# Patient Record
Sex: Male | Born: 1965
Health system: Southern US, Community
[De-identification: ages and names within clinical notes are randomized; demographics above are authoritative.]

---

## 2009-09-05 ENCOUNTER — Emergency Department (HOSPITAL_COMMUNITY): Admission: EM | Admit: 2009-09-05 | Discharge: 2009-09-05 | Payer: Self-pay | Admitting: Emergency Medicine

## 2009-10-06 ENCOUNTER — Encounter: Admission: RE | Admit: 2009-10-06 | Discharge: 2009-10-06 | Payer: Self-pay | Admitting: Occupational Medicine

## 2010-03-17 LAB — CBC
MCV: 91 fL (ref 78.0–100.0)
Platelets: 165 10*3/uL (ref 150–400)
RBC: 4.91 MIL/uL (ref 4.22–5.81)
RDW: 13 % (ref 11.5–15.5)
WBC: 8.7 10*3/uL (ref 4.0–10.5)

## 2010-03-17 LAB — DIFFERENTIAL
Basophils Absolute: 0.1 10*3/uL (ref 0.0–0.1)
Eosinophils Relative: 3 % (ref 0–5)
Lymphocytes Relative: 22 % (ref 12–46)
Lymphs Abs: 1.9 10*3/uL (ref 0.7–4.0)
Neutro Abs: 5.8 10*3/uL (ref 1.7–7.7)

## 2010-03-17 LAB — URINALYSIS, ROUTINE W REFLEX MICROSCOPIC
Glucose, UA: NEGATIVE mg/dL
Nitrite: NEGATIVE
Protein, ur: NEGATIVE mg/dL
pH: 5.5 (ref 5.0–8.0)

## 2010-03-17 LAB — BASIC METABOLIC PANEL
Chloride: 113 mEq/L — ABNORMAL HIGH (ref 96–112)
Creatinine, Ser: 1.18 mg/dL (ref 0.4–1.5)
GFR calc Af Amer: >60 mL/min (ref >60)
GFR calc non Af Amer: >60 mL/min (ref >60)
Potassium: 4.1 mEq/L (ref 3.5–5.1)

## 2013-04-08 ENCOUNTER — Other Ambulatory Visit: Payer: Self-pay | Admitting: Nurse Practitioner

## 2013-04-08 ENCOUNTER — Ambulatory Visit
Admission: RE | Admit: 2013-04-08 | Discharge: 2013-04-08 | Disposition: A | Discharge status: Home / Self Care | Payer: Self-pay | Source: Ambulatory Visit | Attending: Nurse Practitioner | Admitting: Nurse Practitioner

## 2013-04-08 DIAGNOSIS — M25561 Pain in right knee: Secondary | ICD-10-CM

## 2014-04-16 IMAGING — CR DG KNEE COMPLETE 4+V*R*
4 series · 4 of 4 positions shown · non-contrast
Comparison: None.

CLINICAL DATA: Medial right knee pain.  Pain with locking.

EXAM:
RIGHT KNEE - COMPLETE 4+ VIEW

[view not recorded (1 of 4)]
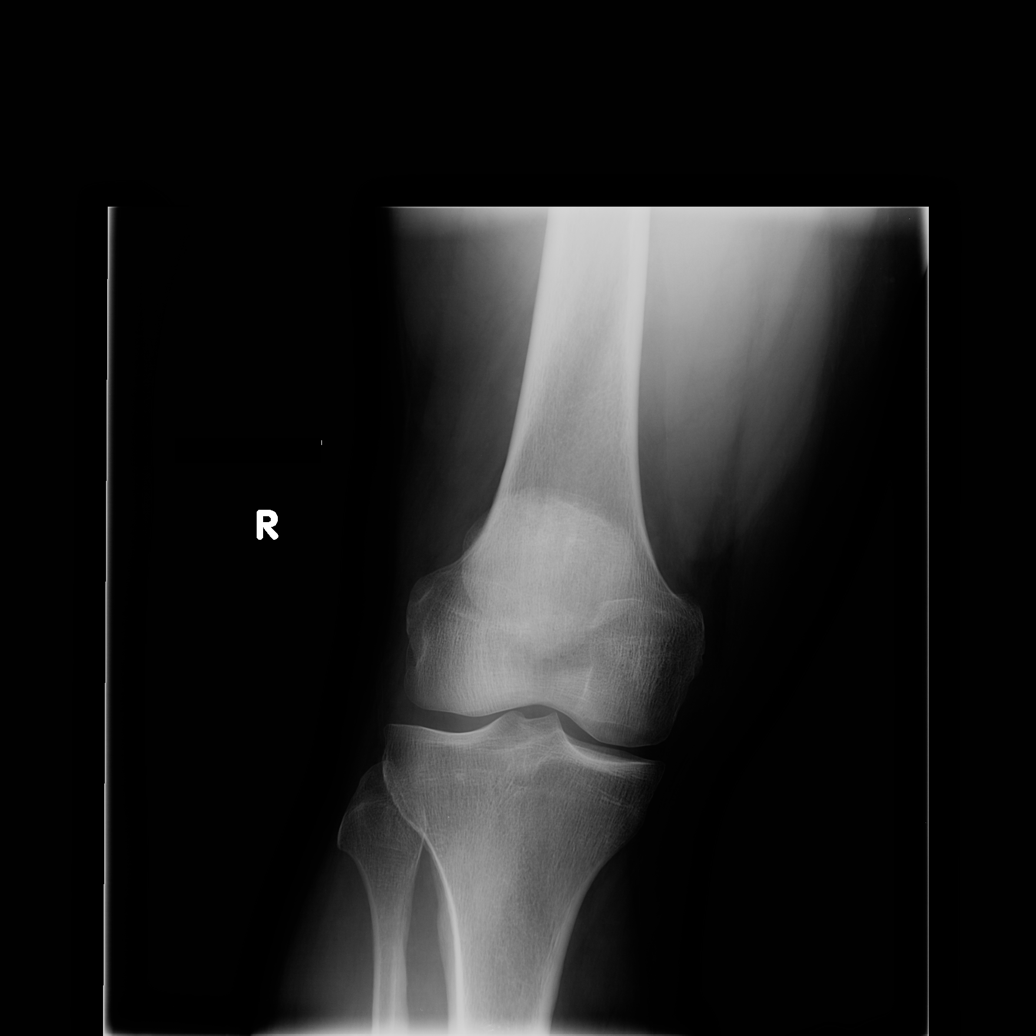

[view not recorded (2 of 4)]
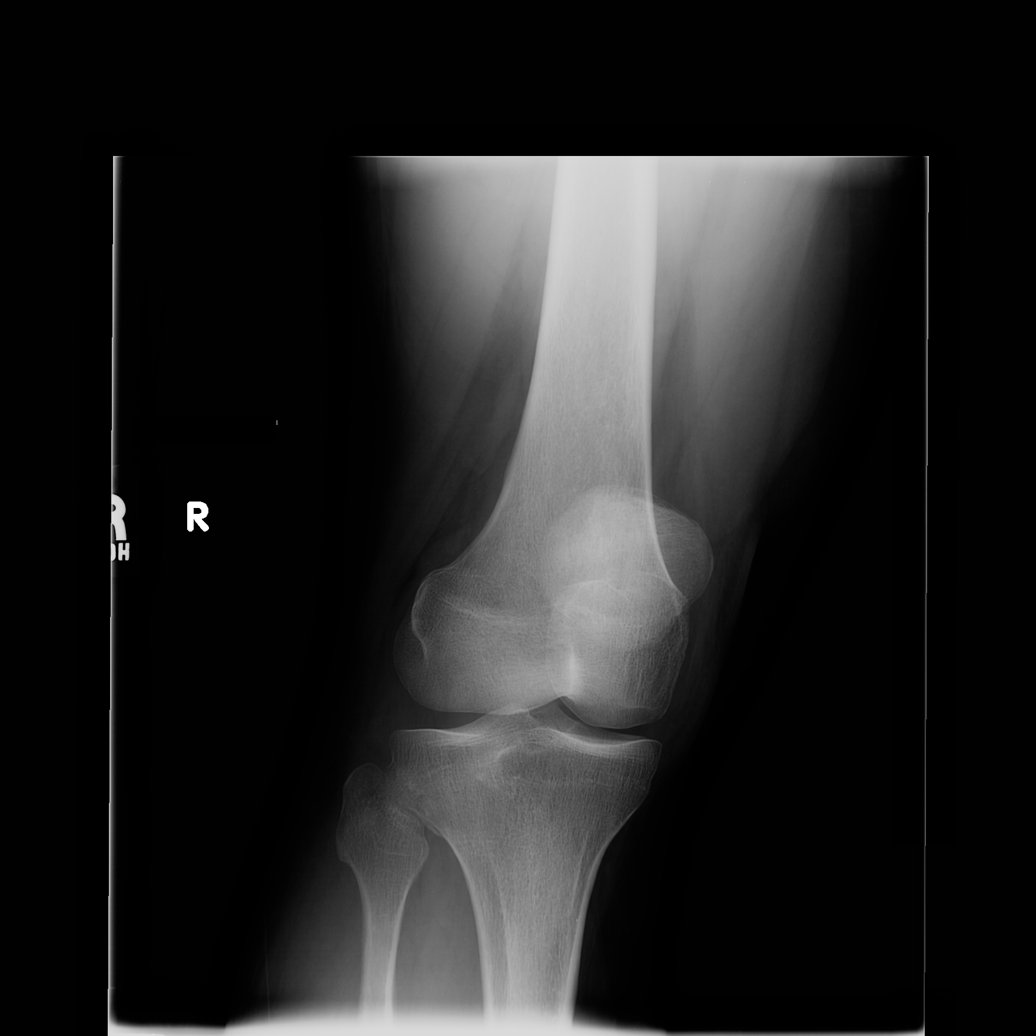

[view not recorded (3 of 4)]
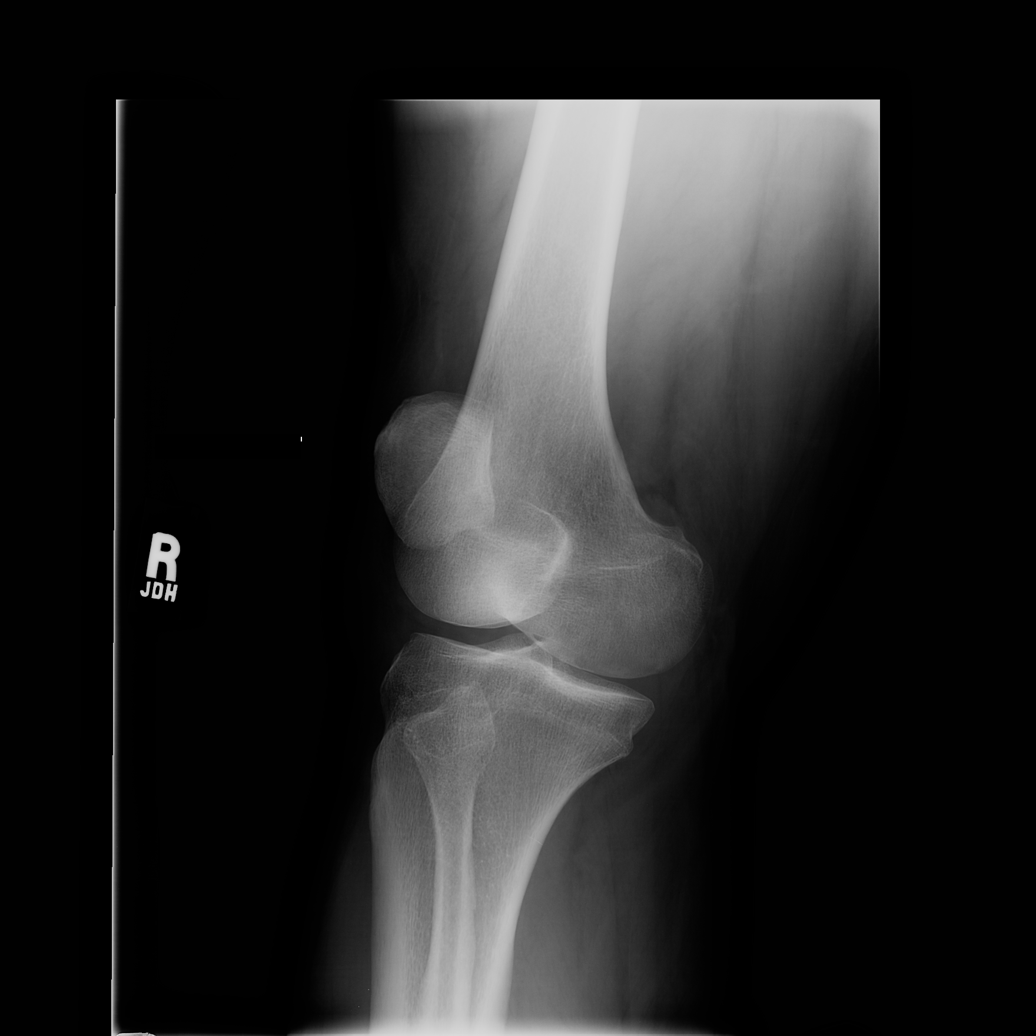

[view not recorded (4 of 4)]
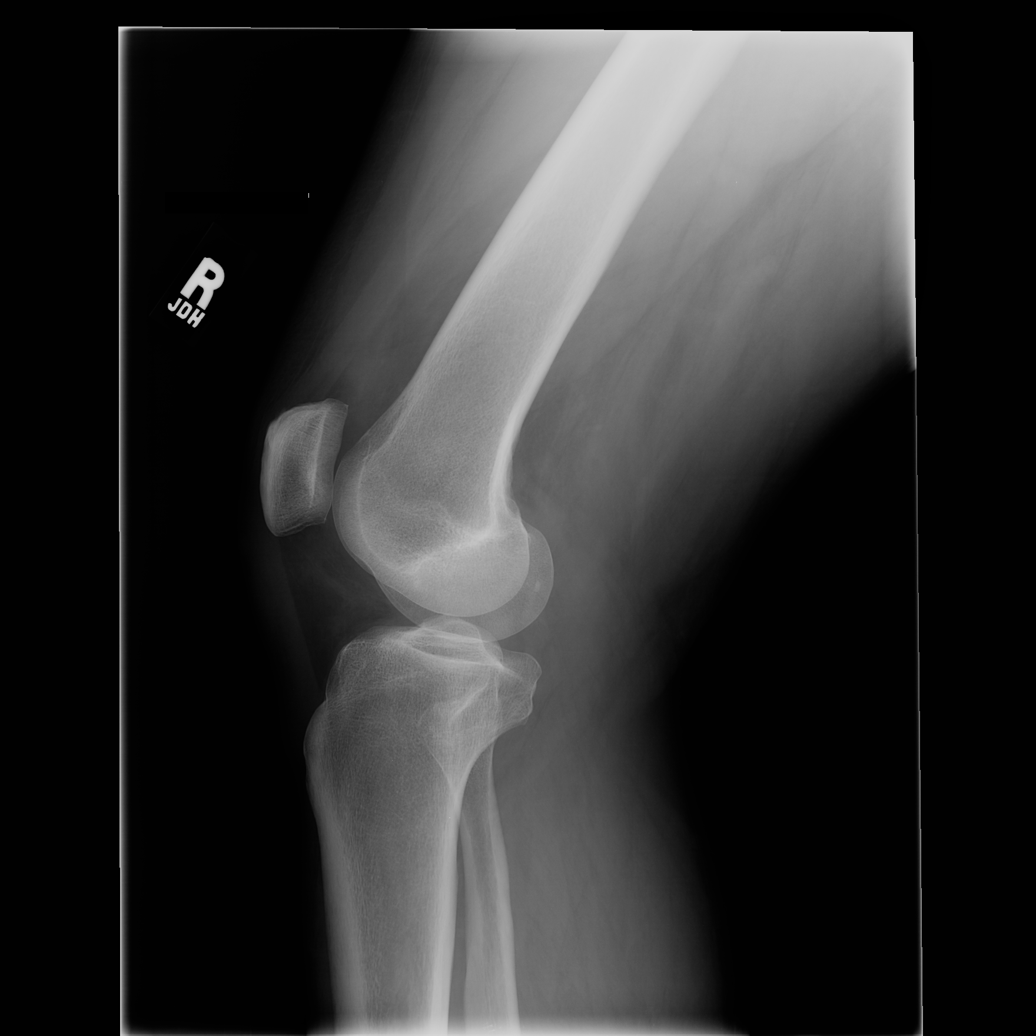

[4 of 4 positions shown; findings below may reference images not displayed]

FINDINGS: Anatomic alignment. Joint spaces are preserved. Tiny marginal
osteophytes are present in the patellofemoral joint. Small
suprapatellar effusion. No destructive osseous lesion or fracture.
IMPRESSION: Mild patellofemoral osteoarthritis and small effusion.

## 2018-09-20 DIAGNOSIS — Z13220 Encounter for screening for lipoid disorders: Secondary | ICD-10-CM | POA: Diagnosis not present

## 2018-09-20 DIAGNOSIS — Z0000 Encounter for general adult medical examination without abnormal findings: Secondary | ICD-10-CM | POA: Diagnosis not present

## 2018-09-20 DIAGNOSIS — Z23 Encounter for immunization: Secondary | ICD-10-CM | POA: Diagnosis not present

## 2018-09-20 DIAGNOSIS — Z125 Encounter for screening for malignant neoplasm of prostate: Secondary | ICD-10-CM | POA: Diagnosis not present

## 2018-10-04 DIAGNOSIS — Z79899 Other long term (current) drug therapy: Secondary | ICD-10-CM | POA: Diagnosis not present

## 2018-10-25 DIAGNOSIS — R7989 Other specified abnormal findings of blood chemistry: Secondary | ICD-10-CM | POA: Diagnosis not present

## 2018-12-23 DIAGNOSIS — Z20828 Contact with and (suspected) exposure to other viral communicable diseases: Secondary | ICD-10-CM | POA: Diagnosis not present

## 2018-12-30 DIAGNOSIS — S90852A Superficial foreign body, left foot, initial encounter: Secondary | ICD-10-CM | POA: Diagnosis not present

## 2018-12-30 DIAGNOSIS — M79672 Pain in left foot: Secondary | ICD-10-CM | POA: Diagnosis not present

## 2019-07-08 ENCOUNTER — Other Ambulatory Visit (HOSPITAL_BASED_OUTPATIENT_CLINIC_OR_DEPARTMENT_OTHER): Payer: Self-pay | Admitting: Internal Medicine

## 2019-09-25 DIAGNOSIS — Z0000 Encounter for general adult medical examination without abnormal findings: Secondary | ICD-10-CM | POA: Diagnosis not present

## 2019-09-25 DIAGNOSIS — E7800 Pure hypercholesterolemia, unspecified: Secondary | ICD-10-CM | POA: Diagnosis not present

## 2019-09-25 DIAGNOSIS — Z125 Encounter for screening for malignant neoplasm of prostate: Secondary | ICD-10-CM | POA: Diagnosis not present

## 2019-09-25 DIAGNOSIS — Z23 Encounter for immunization: Secondary | ICD-10-CM | POA: Diagnosis not present

## 2019-09-25 DIAGNOSIS — I10 Essential (primary) hypertension: Secondary | ICD-10-CM | POA: Diagnosis not present

## 2019-10-17 ENCOUNTER — Other Ambulatory Visit (HOSPITAL_BASED_OUTPATIENT_CLINIC_OR_DEPARTMENT_OTHER): Payer: Self-pay | Admitting: Internal Medicine

## 2019-12-16 ENCOUNTER — Other Ambulatory Visit (HOSPITAL_BASED_OUTPATIENT_CLINIC_OR_DEPARTMENT_OTHER): Payer: Self-pay | Admitting: Family Medicine

## 2019-12-18 ENCOUNTER — Other Ambulatory Visit (HOSPITAL_BASED_OUTPATIENT_CLINIC_OR_DEPARTMENT_OTHER): Payer: Self-pay | Admitting: Family Medicine

## 2020-03-02 ENCOUNTER — Other Ambulatory Visit (HOSPITAL_BASED_OUTPATIENT_CLINIC_OR_DEPARTMENT_OTHER): Payer: Self-pay | Admitting: Internal Medicine

## 2020-04-30 ENCOUNTER — Other Ambulatory Visit (HOSPITAL_BASED_OUTPATIENT_CLINIC_OR_DEPARTMENT_OTHER): Payer: Self-pay

## 2020-04-30 MED FILL — Rosuvastatin Calcium Tab 5 MG: ORAL | 30 days supply | Qty: 30 | Fill #0 | Status: AC

## 2020-04-30 MED FILL — Losartan Potassium Tab 50 MG: ORAL | 30 days supply | Qty: 30 | Fill #0 | Status: AC

## 2020-05-03 ENCOUNTER — Other Ambulatory Visit (HOSPITAL_BASED_OUTPATIENT_CLINIC_OR_DEPARTMENT_OTHER): Payer: Self-pay

## 2020-05-04 ENCOUNTER — Other Ambulatory Visit (HOSPITAL_BASED_OUTPATIENT_CLINIC_OR_DEPARTMENT_OTHER): Payer: Self-pay

## 2020-05-05 ENCOUNTER — Other Ambulatory Visit (HOSPITAL_BASED_OUTPATIENT_CLINIC_OR_DEPARTMENT_OTHER): Payer: Self-pay

## 2020-05-05 MED ORDER — HYDROCHLOROTHIAZIDE 25 MG PO TABS
ORAL_TABLET | ORAL | 3 refills | Status: AC
  Filled 2020-05-05: qty 30, 30d supply, fill #0
  Filled 2020-06-09: qty 30, 30d supply, fill #1
  Filled 2020-07-09: qty 30, 30d supply, fill #2
  Filled 2020-08-04: qty 30, 30d supply, fill #3
  Filled 2020-09-08: qty 30, 30d supply, fill #4
  Filled 2020-10-06: qty 30, 30d supply, fill #5
  Filled 2020-11-08: qty 30, 30d supply, fill #6
  Filled 2021-01-10: qty 30, 30d supply, fill #7

## 2020-05-31 MED FILL — Losartan Potassium Tab 50 MG: ORAL | 30 days supply | Qty: 30 | Fill #1 | Status: CN

## 2020-05-31 MED FILL — Rosuvastatin Calcium Tab 5 MG: ORAL | 30 days supply | Qty: 30 | Fill #1 | Status: CN

## 2020-06-01 ENCOUNTER — Other Ambulatory Visit (HOSPITAL_BASED_OUTPATIENT_CLINIC_OR_DEPARTMENT_OTHER): Payer: Self-pay

## 2020-06-01 MED FILL — Rosuvastatin Calcium Tab 5 MG: ORAL | 30 days supply | Qty: 30 | Fill #1 | Status: AC

## 2020-06-01 MED FILL — Losartan Potassium Tab 50 MG: ORAL | 30 days supply | Qty: 30 | Fill #1 | Status: AC

## 2020-06-09 ENCOUNTER — Other Ambulatory Visit (HOSPITAL_BASED_OUTPATIENT_CLINIC_OR_DEPARTMENT_OTHER): Payer: Self-pay

## 2020-06-29 MED FILL — Losartan Potassium Tab 50 MG: ORAL | 30 days supply | Qty: 30 | Fill #2 | Status: AC

## 2020-06-29 MED FILL — Rosuvastatin Calcium Tab 5 MG: ORAL | 30 days supply | Qty: 30 | Fill #2 | Status: AC

## 2020-06-30 ENCOUNTER — Other Ambulatory Visit (HOSPITAL_BASED_OUTPATIENT_CLINIC_OR_DEPARTMENT_OTHER): Payer: Self-pay

## 2020-07-09 ENCOUNTER — Other Ambulatory Visit (HOSPITAL_BASED_OUTPATIENT_CLINIC_OR_DEPARTMENT_OTHER): Payer: Self-pay

## 2020-08-04 ENCOUNTER — Other Ambulatory Visit (HOSPITAL_BASED_OUTPATIENT_CLINIC_OR_DEPARTMENT_OTHER): Payer: Self-pay

## 2020-08-04 MED FILL — Losartan Potassium Tab 50 MG: ORAL | 30 days supply | Qty: 30 | Fill #3 | Status: AC

## 2020-08-04 MED FILL — Rosuvastatin Calcium Tab 5 MG: ORAL | 30 days supply | Qty: 30 | Fill #3 | Status: AC

## 2020-09-06 MED FILL — Rosuvastatin Calcium Tab 5 MG: ORAL | 30 days supply | Qty: 30 | Fill #4 | Status: AC

## 2020-09-07 ENCOUNTER — Other Ambulatory Visit (HOSPITAL_BASED_OUTPATIENT_CLINIC_OR_DEPARTMENT_OTHER): Payer: Self-pay

## 2020-09-08 ENCOUNTER — Other Ambulatory Visit (HOSPITAL_BASED_OUTPATIENT_CLINIC_OR_DEPARTMENT_OTHER): Payer: Self-pay

## 2020-09-08 MED FILL — Losartan Potassium Tab 50 MG: ORAL | 30 days supply | Qty: 30 | Fill #4 | Status: AC

## 2020-10-06 ENCOUNTER — Other Ambulatory Visit (HOSPITAL_BASED_OUTPATIENT_CLINIC_OR_DEPARTMENT_OTHER): Payer: Self-pay

## 2020-10-06 MED FILL — Rosuvastatin Calcium Tab 5 MG: ORAL | 30 days supply | Qty: 30 | Fill #5 | Status: AC

## 2020-10-06 MED FILL — Losartan Potassium Tab 50 MG: ORAL | 30 days supply | Qty: 30 | Fill #5 | Status: AC

## 2020-11-08 ENCOUNTER — Other Ambulatory Visit (HOSPITAL_BASED_OUTPATIENT_CLINIC_OR_DEPARTMENT_OTHER): Payer: Self-pay

## 2020-11-08 MED ORDER — ROSUVASTATIN CALCIUM 5 MG PO TABS
5.0000 mg | ORAL_TABLET | Freq: Every day | ORAL | 0 refills | Status: DC
  Filled 2020-11-08: qty 30, 30d supply, fill #0
  Filled 2021-08-18: qty 30, 30d supply, fill #1
  Filled 2021-09-19: qty 30, 30d supply, fill #2

## 2020-11-08 MED FILL — Losartan Potassium Tab 50 MG: ORAL | 30 days supply | Qty: 30 | Fill #6 | Status: AC

## 2020-12-07 ENCOUNTER — Other Ambulatory Visit (HOSPITAL_COMMUNITY): Payer: Self-pay

## 2020-12-07 MED ORDER — HYDROCHLOROTHIAZIDE 25 MG PO TABS
25.0000 mg | ORAL_TABLET | Freq: Every day | ORAL | 3 refills | Status: DC
  Filled 2020-12-07 – 2020-12-08 (×2): qty 30, 30d supply, fill #0
  Filled 2021-02-07: qty 30, 30d supply, fill #1
  Filled 2021-03-14 (×2): qty 30, 30d supply, fill #2
  Filled 2021-04-12: qty 30, 30d supply, fill #3
  Filled 2021-04-13: qty 30, 30d supply, fill #0
  Filled 2021-05-13: qty 30, 30d supply, fill #1
  Filled 2021-06-08: qty 30, 30d supply, fill #2
  Filled 2021-07-12: qty 30, 30d supply, fill #3
  Filled 2021-08-18: qty 30, 30d supply, fill #4
  Filled 2021-09-19: qty 30, 30d supply, fill #5
  Filled 2021-10-16: qty 30, 30d supply, fill #6
  Filled 2021-11-16: qty 30, 30d supply, fill #7

## 2020-12-07 MED ORDER — LOSARTAN POTASSIUM 50 MG PO TABS
50.0000 mg | ORAL_TABLET | Freq: Every day | ORAL | 3 refills | Status: DC
  Filled 2020-12-07 – 2020-12-08 (×2): qty 30, 30d supply, fill #0
  Filled 2021-01-10: qty 30, 30d supply, fill #1
  Filled 2021-02-07: qty 30, 30d supply, fill #2
  Filled 2021-03-14 (×2): qty 30, 30d supply, fill #3
  Filled 2021-04-12: qty 30, 30d supply, fill #4
  Filled 2021-04-13: qty 30, 30d supply, fill #0
  Filled 2021-05-13: qty 30, 30d supply, fill #1
  Filled 2021-06-08: qty 30, 30d supply, fill #2
  Filled 2021-07-12: qty 30, 30d supply, fill #3
  Filled 2021-08-18: qty 30, 30d supply, fill #4
  Filled 2021-09-19: qty 30, 30d supply, fill #5
  Filled 2021-10-16: qty 30, 30d supply, fill #6
  Filled 2021-11-15: qty 30, 30d supply, fill #7

## 2020-12-07 MED ORDER — ROSUVASTATIN CALCIUM 5 MG PO TABS
5.0000 mg | ORAL_TABLET | Freq: Every day | ORAL | 3 refills | Status: DC
  Filled 2020-12-07 – 2020-12-08 (×2): qty 30, 30d supply, fill #0
  Filled 2021-01-10: qty 30, 30d supply, fill #1
  Filled 2021-02-07: qty 30, 30d supply, fill #2
  Filled 2021-03-14: qty 8, 8d supply, fill #3
  Filled 2021-03-14: qty 30, 30d supply, fill #3
  Filled 2021-03-14: qty 22, 22d supply, fill #3
  Filled 2021-04-12: qty 30, 30d supply, fill #4
  Filled 2021-04-13: qty 30, 30d supply, fill #0
  Filled 2021-05-13: qty 30, 30d supply, fill #1
  Filled 2021-06-08: qty 30, 30d supply, fill #2
  Filled 2021-07-12: qty 30, 30d supply, fill #3

## 2020-12-08 ENCOUNTER — Other Ambulatory Visit (HOSPITAL_COMMUNITY): Payer: Self-pay

## 2020-12-08 ENCOUNTER — Other Ambulatory Visit (HOSPITAL_BASED_OUTPATIENT_CLINIC_OR_DEPARTMENT_OTHER): Payer: Self-pay

## 2020-12-15 ENCOUNTER — Other Ambulatory Visit (HOSPITAL_BASED_OUTPATIENT_CLINIC_OR_DEPARTMENT_OTHER): Payer: Self-pay

## 2020-12-15 MED ORDER — GAVILYTE-C 240 G PO SOLR
ORAL | 0 refills | Status: AC
  Filled 2020-12-15: qty 4000, 2d supply, fill #0

## 2020-12-16 ENCOUNTER — Other Ambulatory Visit (HOSPITAL_BASED_OUTPATIENT_CLINIC_OR_DEPARTMENT_OTHER): Payer: Self-pay

## 2020-12-17 ENCOUNTER — Other Ambulatory Visit (HOSPITAL_BASED_OUTPATIENT_CLINIC_OR_DEPARTMENT_OTHER): Payer: Self-pay

## 2020-12-24 ENCOUNTER — Other Ambulatory Visit (HOSPITAL_BASED_OUTPATIENT_CLINIC_OR_DEPARTMENT_OTHER): Payer: Self-pay

## 2021-01-10 ENCOUNTER — Other Ambulatory Visit (HOSPITAL_BASED_OUTPATIENT_CLINIC_OR_DEPARTMENT_OTHER): Payer: Self-pay

## 2021-02-07 ENCOUNTER — Other Ambulatory Visit (HOSPITAL_BASED_OUTPATIENT_CLINIC_OR_DEPARTMENT_OTHER): Payer: Self-pay

## 2021-03-14 ENCOUNTER — Other Ambulatory Visit (HOSPITAL_BASED_OUTPATIENT_CLINIC_OR_DEPARTMENT_OTHER): Payer: Self-pay

## 2021-03-14 ENCOUNTER — Other Ambulatory Visit (HOSPITAL_COMMUNITY): Payer: Self-pay

## 2021-04-13 ENCOUNTER — Other Ambulatory Visit (HOSPITAL_COMMUNITY): Payer: Self-pay

## 2021-04-13 ENCOUNTER — Other Ambulatory Visit (HOSPITAL_BASED_OUTPATIENT_CLINIC_OR_DEPARTMENT_OTHER): Payer: Self-pay

## 2021-04-14 ENCOUNTER — Other Ambulatory Visit (HOSPITAL_COMMUNITY): Payer: Self-pay

## 2021-04-14 ENCOUNTER — Other Ambulatory Visit (HOSPITAL_BASED_OUTPATIENT_CLINIC_OR_DEPARTMENT_OTHER): Payer: Self-pay

## 2021-04-15 ENCOUNTER — Other Ambulatory Visit (HOSPITAL_BASED_OUTPATIENT_CLINIC_OR_DEPARTMENT_OTHER): Payer: Self-pay

## 2021-05-13 ENCOUNTER — Other Ambulatory Visit (HOSPITAL_BASED_OUTPATIENT_CLINIC_OR_DEPARTMENT_OTHER): Payer: Self-pay

## 2021-05-16 ENCOUNTER — Other Ambulatory Visit (HOSPITAL_BASED_OUTPATIENT_CLINIC_OR_DEPARTMENT_OTHER): Payer: Self-pay

## 2021-06-08 ENCOUNTER — Other Ambulatory Visit (HOSPITAL_BASED_OUTPATIENT_CLINIC_OR_DEPARTMENT_OTHER): Payer: Self-pay

## 2021-07-12 ENCOUNTER — Other Ambulatory Visit (HOSPITAL_BASED_OUTPATIENT_CLINIC_OR_DEPARTMENT_OTHER): Payer: Self-pay

## 2021-08-18 ENCOUNTER — Other Ambulatory Visit (HOSPITAL_BASED_OUTPATIENT_CLINIC_OR_DEPARTMENT_OTHER): Payer: Self-pay

## 2021-09-19 ENCOUNTER — Other Ambulatory Visit (HOSPITAL_BASED_OUTPATIENT_CLINIC_OR_DEPARTMENT_OTHER): Payer: Self-pay

## 2021-10-16 ENCOUNTER — Other Ambulatory Visit (HOSPITAL_BASED_OUTPATIENT_CLINIC_OR_DEPARTMENT_OTHER): Payer: Self-pay

## 2021-10-17 ENCOUNTER — Other Ambulatory Visit (HOSPITAL_BASED_OUTPATIENT_CLINIC_OR_DEPARTMENT_OTHER): Payer: Self-pay

## 2021-10-19 ENCOUNTER — Other Ambulatory Visit (HOSPITAL_BASED_OUTPATIENT_CLINIC_OR_DEPARTMENT_OTHER): Payer: Self-pay

## 2021-10-19 MED ORDER — ROSUVASTATIN CALCIUM 5 MG PO TABS
ORAL_TABLET | ORAL | 3 refills | Status: DC
  Filled 2021-10-19: qty 22, 22d supply, fill #0
  Filled 2021-10-19: qty 23, 23d supply, fill #0
  Filled 2021-10-19: qty 30, 30d supply, fill #0
  Filled 2021-11-15: qty 30, 30d supply, fill #1
  Filled 2021-12-14: qty 30, 30d supply, fill #2
  Filled 2022-01-17: qty 30, 30d supply, fill #3
  Filled 2022-02-15: qty 30, 30d supply, fill #4
  Filled 2022-03-13: qty 30, 30d supply, fill #5
  Filled 2022-04-12: qty 30, 30d supply, fill #6
  Filled 2022-05-09: qty 30, 30d supply, fill #7
  Filled 2022-06-08: qty 30, 30d supply, fill #8
  Filled 2022-07-04: qty 30, 30d supply, fill #9
  Filled 2022-07-31: qty 30, 30d supply, fill #10
  Filled 2022-08-30: qty 30, 30d supply, fill #11

## 2021-11-16 ENCOUNTER — Other Ambulatory Visit (HOSPITAL_BASED_OUTPATIENT_CLINIC_OR_DEPARTMENT_OTHER): Payer: Self-pay

## 2021-12-14 ENCOUNTER — Other Ambulatory Visit (HOSPITAL_BASED_OUTPATIENT_CLINIC_OR_DEPARTMENT_OTHER): Payer: Self-pay

## 2021-12-15 ENCOUNTER — Other Ambulatory Visit (HOSPITAL_BASED_OUTPATIENT_CLINIC_OR_DEPARTMENT_OTHER): Payer: Self-pay

## 2021-12-15 MED ORDER — LOSARTAN POTASSIUM 50 MG PO TABS
50.0000 mg | ORAL_TABLET | Freq: Every day | ORAL | 3 refills | Status: DC
  Filled 2021-12-15: qty 30, 30d supply, fill #0
  Filled 2022-01-17: qty 30, 30d supply, fill #1
  Filled 2022-02-15: qty 30, 30d supply, fill #2
  Filled 2022-03-13: qty 30, 30d supply, fill #3
  Filled 2022-04-12: qty 30, 30d supply, fill #4
  Filled 2022-05-09: qty 30, 30d supply, fill #5
  Filled 2022-06-08: qty 30, 30d supply, fill #6
  Filled 2022-07-04: qty 30, 30d supply, fill #7
  Filled 2022-07-31: qty 30, 30d supply, fill #8
  Filled 2022-08-30: qty 30, 30d supply, fill #9
  Filled 2022-09-26: qty 30, 30d supply, fill #10
  Filled 2022-10-23: qty 30, 30d supply, fill #11

## 2021-12-15 MED ORDER — HYDROCHLOROTHIAZIDE 25 MG PO TABS
25.0000 mg | ORAL_TABLET | Freq: Every day | ORAL | 3 refills | Status: DC
  Filled 2021-12-15: qty 30, 30d supply, fill #0
  Filled 2022-01-17: qty 30, 30d supply, fill #1
  Filled 2022-02-15: qty 30, 30d supply, fill #2
  Filled 2022-03-13: qty 30, 30d supply, fill #3
  Filled 2022-04-12: qty 30, 30d supply, fill #4
  Filled 2022-05-09: qty 30, 30d supply, fill #5
  Filled 2022-06-08: qty 30, 30d supply, fill #6
  Filled 2022-07-04: qty 30, 30d supply, fill #7
  Filled 2022-07-31: qty 30, 30d supply, fill #8
  Filled 2022-08-30: qty 30, 30d supply, fill #9
  Filled 2022-09-26: qty 30, 30d supply, fill #10
  Filled 2022-10-23: qty 30, 30d supply, fill #11

## 2022-01-17 ENCOUNTER — Other Ambulatory Visit (HOSPITAL_BASED_OUTPATIENT_CLINIC_OR_DEPARTMENT_OTHER): Payer: Self-pay

## 2022-01-19 ENCOUNTER — Other Ambulatory Visit (HOSPITAL_BASED_OUTPATIENT_CLINIC_OR_DEPARTMENT_OTHER): Payer: Self-pay

## 2022-02-15 ENCOUNTER — Other Ambulatory Visit (HOSPITAL_BASED_OUTPATIENT_CLINIC_OR_DEPARTMENT_OTHER): Payer: Self-pay

## 2022-02-15 MED ORDER — TRAMADOL HCL 50 MG PO TABS
ORAL_TABLET | ORAL | 0 refills | Status: AC
  Filled 2022-02-15: qty 5, 1d supply, fill #0

## 2022-02-15 MED ORDER — CHLORHEXIDINE GLUCONATE 0.12 % MT SOLN
OROMUCOSAL | 0 refills | Status: AC
  Filled 2022-02-15: qty 473, 16d supply, fill #0

## 2022-03-10 ENCOUNTER — Other Ambulatory Visit (HOSPITAL_BASED_OUTPATIENT_CLINIC_OR_DEPARTMENT_OTHER): Payer: Self-pay

## 2022-07-24 ENCOUNTER — Other Ambulatory Visit (HOSPITAL_BASED_OUTPATIENT_CLINIC_OR_DEPARTMENT_OTHER): Payer: Self-pay

## 2022-07-24 MED ORDER — DAPAGLIFLOZIN PROPANEDIOL 5 MG PO TABS
5.0000 mg | ORAL_TABLET | Freq: Every day | ORAL | 0 refills | Status: AC
  Filled 2022-07-24 – 2022-07-31 (×3): qty 30, 30d supply, fill #0

## 2022-07-25 ENCOUNTER — Other Ambulatory Visit (HOSPITAL_BASED_OUTPATIENT_CLINIC_OR_DEPARTMENT_OTHER): Payer: Self-pay

## 2022-07-25 ENCOUNTER — Other Ambulatory Visit: Payer: Self-pay

## 2022-07-27 ENCOUNTER — Other Ambulatory Visit (HOSPITAL_BASED_OUTPATIENT_CLINIC_OR_DEPARTMENT_OTHER): Payer: Self-pay

## 2022-07-28 ENCOUNTER — Other Ambulatory Visit (HOSPITAL_BASED_OUTPATIENT_CLINIC_OR_DEPARTMENT_OTHER): Payer: Self-pay

## 2022-07-31 ENCOUNTER — Other Ambulatory Visit (HOSPITAL_BASED_OUTPATIENT_CLINIC_OR_DEPARTMENT_OTHER): Payer: Self-pay

## 2022-08-01 ENCOUNTER — Other Ambulatory Visit (HOSPITAL_BASED_OUTPATIENT_CLINIC_OR_DEPARTMENT_OTHER): Payer: Self-pay

## 2022-08-03 ENCOUNTER — Other Ambulatory Visit (HOSPITAL_BASED_OUTPATIENT_CLINIC_OR_DEPARTMENT_OTHER): Payer: Self-pay

## 2022-08-08 ENCOUNTER — Other Ambulatory Visit (HOSPITAL_BASED_OUTPATIENT_CLINIC_OR_DEPARTMENT_OTHER): Payer: Self-pay

## 2022-08-08 MED ORDER — JARDIANCE 10 MG PO TABS
10.0000 mg | ORAL_TABLET | Freq: Every day | ORAL | 0 refills | Status: DC
  Filled 2022-08-08: qty 30, 30d supply, fill #0

## 2022-08-09 ENCOUNTER — Other Ambulatory Visit (HOSPITAL_BASED_OUTPATIENT_CLINIC_OR_DEPARTMENT_OTHER): Payer: Self-pay

## 2022-09-15 ENCOUNTER — Other Ambulatory Visit (HOSPITAL_BASED_OUTPATIENT_CLINIC_OR_DEPARTMENT_OTHER): Payer: Self-pay

## 2022-09-17 ENCOUNTER — Other Ambulatory Visit (HOSPITAL_BASED_OUTPATIENT_CLINIC_OR_DEPARTMENT_OTHER): Payer: Self-pay

## 2022-09-18 ENCOUNTER — Other Ambulatory Visit (HOSPITAL_BASED_OUTPATIENT_CLINIC_OR_DEPARTMENT_OTHER): Payer: Self-pay

## 2022-09-18 MED ORDER — JARDIANCE 25 MG PO TABS
25.0000 mg | ORAL_TABLET | Freq: Every day | ORAL | 3 refills | Status: DC
  Filled 2022-09-18: qty 30, 30d supply, fill #0
  Filled 2022-10-11: qty 30, 30d supply, fill #1
  Filled 2022-11-10: qty 30, 30d supply, fill #2
  Filled 2022-12-11: qty 30, 30d supply, fill #3

## 2022-09-26 ENCOUNTER — Other Ambulatory Visit (HOSPITAL_BASED_OUTPATIENT_CLINIC_OR_DEPARTMENT_OTHER): Payer: Self-pay

## 2022-09-26 ENCOUNTER — Other Ambulatory Visit: Payer: Self-pay

## 2022-09-26 MED ORDER — ROSUVASTATIN CALCIUM 5 MG PO TABS
5.0000 mg | ORAL_TABLET | Freq: Every day | ORAL | 3 refills | Status: DC
  Filled 2022-09-26: qty 30, 30d supply, fill #0
  Filled 2022-10-19: qty 30, 30d supply, fill #1
  Filled 2022-11-20: qty 30, 30d supply, fill #2
  Filled 2022-12-18: qty 30, 30d supply, fill #3

## 2022-11-10 ENCOUNTER — Other Ambulatory Visit (HOSPITAL_BASED_OUTPATIENT_CLINIC_OR_DEPARTMENT_OTHER): Payer: Self-pay

## 2022-11-13 ENCOUNTER — Other Ambulatory Visit (HOSPITAL_BASED_OUTPATIENT_CLINIC_OR_DEPARTMENT_OTHER): Payer: Self-pay

## 2022-11-20 ENCOUNTER — Other Ambulatory Visit (HOSPITAL_BASED_OUTPATIENT_CLINIC_OR_DEPARTMENT_OTHER): Payer: Self-pay

## 2022-11-21 ENCOUNTER — Other Ambulatory Visit (HOSPITAL_BASED_OUTPATIENT_CLINIC_OR_DEPARTMENT_OTHER): Payer: Self-pay

## 2022-11-21 MED ORDER — HYDROCHLOROTHIAZIDE 25 MG PO TABS
25.0000 mg | ORAL_TABLET | Freq: Every day | ORAL | 3 refills | Status: DC
  Filled 2022-11-21: qty 90, 90d supply, fill #0
  Filled 2023-02-16: qty 90, 90d supply, fill #1
  Filled 2023-05-21: qty 90, 90d supply, fill #2
  Filled 2023-08-20: qty 90, 90d supply, fill #3

## 2022-11-21 MED ORDER — LOSARTAN POTASSIUM 50 MG PO TABS
50.0000 mg | ORAL_TABLET | Freq: Every day | ORAL | 0 refills | Status: DC
  Filled 2022-11-21: qty 90, 90d supply, fill #0

## 2022-11-22 ENCOUNTER — Other Ambulatory Visit (HOSPITAL_BASED_OUTPATIENT_CLINIC_OR_DEPARTMENT_OTHER): Payer: Self-pay

## 2022-11-22 MED ORDER — ROSUVASTATIN CALCIUM 10 MG PO TABS
10.0000 mg | ORAL_TABLET | Freq: Every evening | ORAL | 3 refills | Status: DC
  Filled 2022-11-22: qty 90, 90d supply, fill #0
  Filled 2023-02-13 – 2023-04-08 (×4): qty 90, 90d supply, fill #1
  Filled 2023-06-27 – 2023-07-16 (×2): qty 90, 90d supply, fill #2

## 2022-12-11 ENCOUNTER — Other Ambulatory Visit (HOSPITAL_BASED_OUTPATIENT_CLINIC_OR_DEPARTMENT_OTHER): Payer: Self-pay

## 2022-12-18 ENCOUNTER — Other Ambulatory Visit: Payer: Self-pay

## 2022-12-19 ENCOUNTER — Other Ambulatory Visit (HOSPITAL_BASED_OUTPATIENT_CLINIC_OR_DEPARTMENT_OTHER): Payer: Self-pay

## 2023-01-29 ENCOUNTER — Other Ambulatory Visit (HOSPITAL_COMMUNITY): Payer: Self-pay

## 2023-01-29 ENCOUNTER — Other Ambulatory Visit: Payer: Self-pay

## 2023-01-29 ENCOUNTER — Other Ambulatory Visit (HOSPITAL_BASED_OUTPATIENT_CLINIC_OR_DEPARTMENT_OTHER): Payer: Self-pay

## 2023-01-29 MED ORDER — JARDIANCE 25 MG PO TABS
25.0000 mg | ORAL_TABLET | Freq: Every day | ORAL | 0 refills | Status: AC
  Filled 2023-01-29: qty 90, 90d supply, fill #0

## 2023-01-29 MED ORDER — JARDIANCE 25 MG PO TABS
25.0000 mg | ORAL_TABLET | Freq: Every day | ORAL | 0 refills | Status: AC
  Filled 2023-01-29 – 2023-04-30 (×2): qty 90, 90d supply, fill #0

## 2023-01-29 MED ORDER — JARDIANCE 25 MG PO TABS
25.0000 mg | ORAL_TABLET | Freq: Every day | ORAL | 0 refills | Status: AC
  Filled 2023-07-16: qty 90, 90d supply, fill #0

## 2023-01-30 ENCOUNTER — Other Ambulatory Visit (HOSPITAL_BASED_OUTPATIENT_CLINIC_OR_DEPARTMENT_OTHER): Payer: Self-pay

## 2023-01-30 MED ORDER — JARDIANCE 25 MG PO TABS
25.0000 mg | ORAL_TABLET | Freq: Every day | ORAL | 0 refills | Status: AC
  Filled 2023-01-30 – 2023-10-29 (×2): qty 90, 90d supply, fill #0

## 2023-02-16 ENCOUNTER — Other Ambulatory Visit (HOSPITAL_BASED_OUTPATIENT_CLINIC_OR_DEPARTMENT_OTHER): Payer: Self-pay

## 2023-02-16 ENCOUNTER — Other Ambulatory Visit: Payer: Self-pay

## 2023-02-16 MED ORDER — LOSARTAN POTASSIUM 50 MG PO TABS
50.0000 mg | ORAL_TABLET | Freq: Every day | ORAL | 1 refills | Status: DC
Start: 2023-02-16 — End: 2023-08-20
  Filled 2023-02-16: qty 90, 90d supply, fill #0
  Filled 2023-05-21: qty 90, 90d supply, fill #1

## 2023-02-26 ENCOUNTER — Other Ambulatory Visit (HOSPITAL_BASED_OUTPATIENT_CLINIC_OR_DEPARTMENT_OTHER): Payer: Self-pay

## 2023-03-12 ENCOUNTER — Other Ambulatory Visit (HOSPITAL_BASED_OUTPATIENT_CLINIC_OR_DEPARTMENT_OTHER): Payer: Self-pay

## 2023-03-23 ENCOUNTER — Other Ambulatory Visit (HOSPITAL_BASED_OUTPATIENT_CLINIC_OR_DEPARTMENT_OTHER): Payer: Self-pay

## 2023-04-11 ENCOUNTER — Other Ambulatory Visit (HOSPITAL_BASED_OUTPATIENT_CLINIC_OR_DEPARTMENT_OTHER): Payer: Self-pay

## 2023-04-13 ENCOUNTER — Other Ambulatory Visit (HOSPITAL_BASED_OUTPATIENT_CLINIC_OR_DEPARTMENT_OTHER): Payer: Self-pay

## 2023-04-13 MED ORDER — PREDNISONE 5 MG PO TABS
ORAL_TABLET | ORAL | 0 refills | Status: AC
  Filled 2023-04-13: qty 21, 6d supply, fill #0

## 2023-04-13 MED ORDER — AZITHROMYCIN 250 MG PO TABS
ORAL_TABLET | ORAL | 0 refills | Status: AC
  Filled 2023-04-13: qty 6, 5d supply, fill #0

## 2023-04-30 ENCOUNTER — Other Ambulatory Visit (HOSPITAL_BASED_OUTPATIENT_CLINIC_OR_DEPARTMENT_OTHER): Payer: Self-pay

## 2023-06-27 ENCOUNTER — Other Ambulatory Visit (HOSPITAL_COMMUNITY): Payer: Self-pay

## 2023-06-27 ENCOUNTER — Other Ambulatory Visit (HOSPITAL_BASED_OUTPATIENT_CLINIC_OR_DEPARTMENT_OTHER): Payer: Self-pay

## 2023-07-16 ENCOUNTER — Other Ambulatory Visit (HOSPITAL_BASED_OUTPATIENT_CLINIC_OR_DEPARTMENT_OTHER): Payer: Self-pay

## 2023-08-20 ENCOUNTER — Other Ambulatory Visit: Payer: Self-pay

## 2023-08-20 ENCOUNTER — Other Ambulatory Visit (HOSPITAL_BASED_OUTPATIENT_CLINIC_OR_DEPARTMENT_OTHER): Payer: Self-pay

## 2023-08-20 MED ORDER — LOSARTAN POTASSIUM 50 MG PO TABS
50.0000 mg | ORAL_TABLET | Freq: Every day | ORAL | 0 refills | Status: DC
  Filled 2023-08-20: qty 90, 90d supply, fill #0

## 2023-08-23 ENCOUNTER — Other Ambulatory Visit: Payer: Self-pay

## 2023-08-24 ENCOUNTER — Other Ambulatory Visit (HOSPITAL_BASED_OUTPATIENT_CLINIC_OR_DEPARTMENT_OTHER): Payer: Self-pay

## 2023-08-24 MED ORDER — LOSARTAN POTASSIUM 50 MG PO TABS
50.0000 mg | ORAL_TABLET | Freq: Every day | ORAL | 0 refills | Status: AC
  Filled 2023-08-24: qty 90, 90d supply, fill #0

## 2023-08-24 MED ORDER — JARDIANCE 25 MG PO TABS
25.0000 mg | ORAL_TABLET | Freq: Every day | ORAL | 0 refills | Status: AC
  Filled 2023-08-24 – 2024-01-22 (×2): qty 90, 90d supply, fill #0

## 2023-08-24 MED ORDER — ROSUVASTATIN CALCIUM 10 MG PO TABS
10.0000 mg | ORAL_TABLET | Freq: Every day | ORAL | 3 refills | Status: AC
  Filled 2023-08-24 – 2023-09-28 (×3): qty 90, 90d supply, fill #0
  Filled 2023-12-24 – 2024-01-11 (×3): qty 90, 90d supply, fill #1

## 2023-08-24 MED ORDER — HYDROCHLOROTHIAZIDE 25 MG PO TABS
25.0000 mg | ORAL_TABLET | Freq: Every day | ORAL | 3 refills | Status: AC
  Filled 2023-08-24 – 2023-11-19 (×3): qty 90, 90d supply, fill #0

## 2023-08-27 ENCOUNTER — Other Ambulatory Visit (HOSPITAL_BASED_OUTPATIENT_CLINIC_OR_DEPARTMENT_OTHER): Payer: Self-pay

## 2023-09-28 ENCOUNTER — Other Ambulatory Visit (HOSPITAL_BASED_OUTPATIENT_CLINIC_OR_DEPARTMENT_OTHER): Payer: Self-pay

## 2023-10-16 ENCOUNTER — Other Ambulatory Visit (HOSPITAL_BASED_OUTPATIENT_CLINIC_OR_DEPARTMENT_OTHER): Payer: Self-pay

## 2023-10-16 MED ORDER — AZITHROMYCIN 250 MG PO TABS
ORAL_TABLET | ORAL | 0 refills | Status: AC
  Filled 2023-10-16: qty 6, 5d supply, fill #0

## 2023-10-16 MED ORDER — PREDNISONE 5 MG (21) PO TBPK
ORAL_TABLET | ORAL | 0 refills | Status: AC
  Filled 2023-10-16: qty 21, 6d supply, fill #0

## 2023-10-29 ENCOUNTER — Other Ambulatory Visit (HOSPITAL_BASED_OUTPATIENT_CLINIC_OR_DEPARTMENT_OTHER): Payer: Self-pay

## 2023-10-30 ENCOUNTER — Other Ambulatory Visit (HOSPITAL_BASED_OUTPATIENT_CLINIC_OR_DEPARTMENT_OTHER): Payer: Self-pay

## 2023-10-30 MED ORDER — JARDIANCE 25 MG PO TABS
25.0000 mg | ORAL_TABLET | Freq: Every day | ORAL | 1 refills | Status: AC
  Filled 2023-10-30: qty 90, 90d supply, fill #0

## 2023-10-30 MED ORDER — LOSARTAN POTASSIUM 50 MG PO TABS
50.0000 mg | ORAL_TABLET | Freq: Every day | ORAL | 1 refills | Status: AC
  Filled 2023-10-30 – 2023-11-02 (×2): qty 90, 90d supply, fill #0

## 2023-11-02 ENCOUNTER — Other Ambulatory Visit (HOSPITAL_BASED_OUTPATIENT_CLINIC_OR_DEPARTMENT_OTHER): Payer: Self-pay

## 2023-11-16 ENCOUNTER — Other Ambulatory Visit (HOSPITAL_BASED_OUTPATIENT_CLINIC_OR_DEPARTMENT_OTHER): Payer: Self-pay

## 2023-11-19 ENCOUNTER — Other Ambulatory Visit (HOSPITAL_BASED_OUTPATIENT_CLINIC_OR_DEPARTMENT_OTHER): Payer: Self-pay

## 2023-11-23 ENCOUNTER — Other Ambulatory Visit (HOSPITAL_BASED_OUTPATIENT_CLINIC_OR_DEPARTMENT_OTHER): Payer: Self-pay

## 2023-11-23 MED ORDER — ROSUVASTATIN CALCIUM 10 MG PO TABS
10.0000 mg | ORAL_TABLET | Freq: Every day | ORAL | 3 refills | Status: AC
Start: 2023-11-23 — End: ?
  Filled 2023-11-23: qty 90, 90d supply, fill #0

## 2023-12-24 ENCOUNTER — Other Ambulatory Visit (HOSPITAL_BASED_OUTPATIENT_CLINIC_OR_DEPARTMENT_OTHER): Payer: Self-pay

## 2023-12-28 ENCOUNTER — Other Ambulatory Visit (HOSPITAL_BASED_OUTPATIENT_CLINIC_OR_DEPARTMENT_OTHER): Payer: Self-pay

## 2024-01-22 ENCOUNTER — Other Ambulatory Visit (HOSPITAL_BASED_OUTPATIENT_CLINIC_OR_DEPARTMENT_OTHER): Payer: Self-pay

## 2024-01-22 ENCOUNTER — Other Ambulatory Visit: Payer: Self-pay
